# Patient Record
Sex: Female | Born: 1990 | Race: Black or African American | Hispanic: No | Marital: Single | Smoking: Current every day smoker
Health system: Southern US, Community
[De-identification: ages and names within clinical notes are randomized; demographics above are authoritative.]

---

## 2017-06-23 ENCOUNTER — Emergency Department (HOSPITAL_COMMUNITY)
Admission: EM | Admit: 2017-06-23 | Discharge: 2017-06-23 | Disposition: A | Discharge status: Home / Self Care | Payer: Self-pay | Attending: Emergency Medicine | Admitting: Emergency Medicine

## 2017-06-23 ENCOUNTER — Emergency Department (HOSPITAL_COMMUNITY): Payer: Self-pay

## 2017-06-23 ENCOUNTER — Encounter (HOSPITAL_COMMUNITY): Payer: Self-pay

## 2017-06-23 DIAGNOSIS — R519 Headache, unspecified: Secondary | ICD-10-CM

## 2017-06-23 DIAGNOSIS — F17200 Nicotine dependence, unspecified, uncomplicated: Secondary | ICD-10-CM | POA: Insufficient documentation

## 2017-06-23 DIAGNOSIS — R51 Headache: Secondary | ICD-10-CM | POA: Insufficient documentation

## 2017-06-23 LAB — SEDIMENTATION RATE: Sed Rate: 1 mm/hr (ref 0–22)

## 2017-06-23 LAB — COMPREHENSIVE METABOLIC PANEL
ALBUMIN: 3.6 g/dL (ref 3.5–5.0)
ALT: 15 U/L (ref 14–54)
ANION GAP: 5 (ref 5–15)
AST: 23 U/L (ref 15–41)
Alkaline Phosphatase: 38 U/L (ref 38–126)
BILIRUBIN TOTAL: 0.3 mg/dL (ref 0.3–1.2)
BUN: 13 mg/dL (ref 6–20)
CHLORIDE: 108 mmol/L (ref 101–111)
CO2: 27 mmol/L (ref 22–32)
Calcium: 8.8 mg/dL — ABNORMAL LOW (ref 8.9–10.3)
Creatinine, Ser: 0.75 mg/dL (ref 0.44–1.00)
GFR calc Af Amer: >60 mL/min (ref >60)
GFR calc non Af Amer: >60 mL/min (ref >60)
GLUCOSE: 94 mg/dL (ref 65–99)
POTASSIUM: 4.1 mmol/L (ref 3.5–5.1)
SODIUM: 140 mmol/L (ref 135–145)
Total Protein: 6.3 g/dL — ABNORMAL LOW (ref 6.5–8.1)

## 2017-06-23 LAB — CBC WITH DIFFERENTIAL/PLATELET
BASOS ABS: 0 10*3/uL (ref 0.0–0.1)
BASOS PCT: 0 %
EOS ABS: 0.2 10*3/uL (ref 0.0–0.7)
Eosinophils Relative: 3 %
HEMATOCRIT: 35.7 % — AB (ref 36.0–46.0)
Hemoglobin: 12.3 g/dL (ref 12.0–15.0)
Lymphocytes Relative: 50 %
Lymphs Abs: 2.9 10*3/uL (ref 0.7–4.0)
MCH: 32.5 pg (ref 26.0–34.0)
MCHC: 34.5 g/dL (ref 30.0–36.0)
MCV: 94.4 fL (ref 78.0–100.0)
MONO ABS: 0.4 10*3/uL (ref 0.1–1.0)
Monocytes Relative: 7 %
NEUTROS ABS: 2.2 10*3/uL (ref 1.7–7.7)
Neutrophils Relative %: 40 %
PLATELETS: 163 10*3/uL (ref 150–400)
RBC: 3.78 MIL/uL — ABNORMAL LOW (ref 3.87–5.11)
RDW: 12.6 % (ref 11.5–15.5)
WBC: 5.7 10*3/uL (ref 4.0–10.5)

## 2017-06-23 MED ORDER — METHYLPREDNISOLONE SODIUM SUCC 125 MG IJ SOLR
125.0000 mg | Freq: Once | INTRAMUSCULAR | Status: AC
  Administered 2017-06-23: 125 mg via INTRAVENOUS
  Filled 2017-06-23: qty 2

## 2017-06-23 MED ORDER — KETOROLAC TROMETHAMINE 30 MG/ML IJ SOLN
30.0000 mg | Freq: Once | INTRAMUSCULAR | Status: AC
  Administered 2017-06-23: 30 mg via INTRAVENOUS
  Filled 2017-06-23: qty 1

## 2017-06-23 MED ORDER — DIPHENHYDRAMINE HCL 50 MG/ML IJ SOLN
25.0000 mg | Freq: Once | INTRAMUSCULAR | Status: AC
  Administered 2017-06-23: 25 mg via INTRAVENOUS
  Filled 2017-06-23: qty 1

## 2017-06-23 MED ORDER — SODIUM CHLORIDE 0.9 % IV BOLUS (SEPSIS)
1000.0000 mL | Freq: Once | INTRAVENOUS | Status: AC
  Administered 2017-06-23: 1000 mL via INTRAVENOUS

## 2017-06-23 MED ORDER — METOCLOPRAMIDE HCL 5 MG/ML IJ SOLN
10.0000 mg | Freq: Once | INTRAMUSCULAR | Status: AC
Start: 2017-06-23 — End: 2017-06-23
  Administered 2017-06-23: 10 mg via INTRAVENOUS
  Filled 2017-06-23: qty 2

## 2017-06-23 NOTE — ED Provider Notes (Signed)
[facility]-EMERGENCY DEPT Provider Note   CSN: [id number] Arrival date & time: 06/23/17  0739     History   Chief Complaint Chief Complaint  Patient presents with  . Headache    HPI Kamarii D Briles is a 26 y.o. female.   Headache   This is a new problem. The current episode started more than 2 days ago. The problem occurs constantly. The problem has not changed since onset.The headache is associated with nothing. The quality of the pain is described as sharp. The pain is moderate. The pain does not radiate. Pertinent negatives include no anorexia. She has tried nothing for the symptoms. The treatment provided no relief.    History reviewed. No pertinent past medical history.  There are no active problems to display for this patient.   History reviewed. No pertinent surgical history.  OB History    No data available       Home Medications    Prior to Admission medications   Medication Sig Start Date End Date Taking? Authorizing Provider  ibuprofen (ADVIL,MOTRIN) 200 MG tablet Take 400 mg by mouth every 6 (six) hours as needed for headache or moderate pain.   Yes [provider]    Family History No family history on file.  Social History Social History  Substance Use Topics  . Smoking status: Current Every Day Smoker  . Smokeless tobacco: Never Used  . Alcohol use No     Allergies   Patient has no known allergies.   Review of Systems Review of Systems  Gastrointestinal: Negative for anorexia.  Neurological: Positive for headaches.  All other systems reviewed and are negative.    Physical Exam Updated Vital Signs BP 127/86   Pulse 66   Temp 98.7 F (37.1 C)   Resp 16   Wt 54.4 kg (120 lb)   LMP 06/21/2017   SpO2 100%   Physical Exam  Constitutional: She is oriented to person, place, and time. She appears well-developed and well-nourished.  HENT:  Head: Normocephalic and atraumatic.  Eyes: Conjunctivae and EOM are normal.  Neck:  Normal range of motion.  Cardiovascular: Normal rate and regular rhythm.   Pulmonary/Chest: No stridor. No respiratory distress.  Abdominal: Soft. She exhibits no distension.  Neurological: She is alert and oriented to person, place, and time.  No altered mental status, able to give full seemingly accurate history.  Face is symmetric, EOM's intact, pupils equal and reactive, vision intact, tongue and uvula midline without deviation. Upper and Lower extremity motor 5/5, intact pain perception in distal extremities, 2+ reflexes in biceps, patella and achilles tendons. Able to perform finger to nose normal with both hands. Walks without assistance or evident ataxia.    Skin: Skin is warm and dry.  Nursing note and vitals reviewed.    ED Treatments / Results  Labs (all labs ordered are listed, but only abnormal results are displayed) Labs Reviewed  CBC WITH DIFFERENTIAL/PLATELET - Abnormal; Notable for the following:       Result Value   RBC 3.78 (*)    HCT 35.7 (*)    All other components within normal limits  COMPREHENSIVE METABOLIC PANEL - Abnormal; Notable for the following:    Calcium 8.8 (*)    Total Protein 6.3 (*)    All other components within normal limits  SEDIMENTATION RATE    EKG  EKG Interpretation None       Radiology Ct Head Wo Contrast  Result Date: 06/23/2017 CLINICAL DATA:  26-year-old   female with headache and dizziness for several days. EXAM: CT HEAD WITHOUT CONTRAST TECHNIQUE: Contiguous axial images were obtained from the base of the skull through the vertex without intravenous contrast. COMPARISON:  None. FINDINGS: Brain: No evidence of acute infarction, hemorrhage, hydrocephalus, extra-axial collection or mass lesion/mass effect. Vascular: No hyperdense vessel or unexpected calcification. Skull: Normal. Negative for fracture or focal lesion. Sinuses/Orbits: No acute finding. IMPRESSION: Unremarkable CT evaluation of the brain. Electronically Signed   By:  Kristopher Oppenheim M.D.   On: 06/23/2017 10:26    Procedures Procedures (including critical care time)  Medications Ordered in ED Medications  metoCLOPramide (REGLAN) injection 10 mg (10 mg Intravenous Given 06/23/17 1047)  methylPREDNISolone sodium succinate (SOLU-MEDROL) 125 mg/2 mL injection 125 mg (125 mg Intravenous Given 06/23/17 1045)  sodium chloride 0.9 % bolus 1,000 mL (1,000 mLs Intravenous New Bag/Given 06/23/17 1040)  diphenhydrAMINE (BENADRYL) injection 25 mg (25 mg Intravenous Given 06/23/17 1042)  ketorolac (TORADOL) 30 MG/ML injection 30 mg (30 mg Intravenous Given 06/23/17 1111)     Initial Impression / Assessment and Plan / ED Course  I have reviewed the triage vital signs and the nursing notes.  Pertinent labs & imaging results that were available during my care of the patient were reviewed by me and considered in my medical decision making (see chart for details).     The patient arrived with signs and symptoms consistent with a likely migraine headache. The patient has no history of migraines.The patient has a reassuring neuro exam lessening chances of intracranial abnormalities however 2/2 newness a CT/labs/ESR (HA centered around right temple) done and normal. The patient was given decadron, Reglan, Benadryl with patient's pain significantly improved and is ready for discharge. The patient remained in no acute distress, hemodynamically stable with reassuring neuro exam. The patient was then discharged from the Emergency Department with no further acute issues. Recommend follow up with neurologist or PCP within the next week.    Final Clinical Impressions(s) / ED Diagnoses   Final diagnoses:  Nonintractable headache, unspecified chronicity pattern, unspecified headache type    New Prescriptions New Prescriptions   No medications on file     Hammad Finkler, Corene Cornea, MD 06/23/17 1228

## 2017-06-23 NOTE — ED Notes (Signed)
Walked patient to the bathroom patient did well 

## 2017-06-23 NOTE — ED Notes (Signed)
Got patient on the monitor call bell in reach patient is resting

## 2017-06-23 NOTE — ED Triage Notes (Signed)
Pt presents to the ed with complaints of headache that is stabbing in nature on the right side x 1 week. Denies any trauma.  Pt is alert, oriented and ambulatory. Reports that it is worse in bright lights.

## 2019-01-24 IMAGING — CT CT HEAD W/O CM
3 series · 15 of 46 positions shown, 18 images · non-contrast
Comparison: None.

CLINICAL DATA: 26-year-old female with headache and dizziness for
several days.

EXAM:
CT HEAD WITHOUT CONTRAST
TECHNIQUE: Contiguous axial images were obtained from the base of the skull
through the vertex without intravenous contrast.

[Series 3: head 5.0 h30s · axial · 0.41mm/px · z∈[-154,-34]mm · 9 of 29 slices shown, 12 images]
[im 3/29  brain]
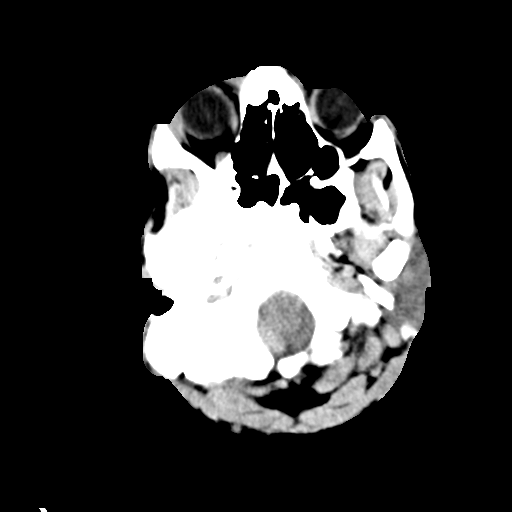
[im 3/29  bone]
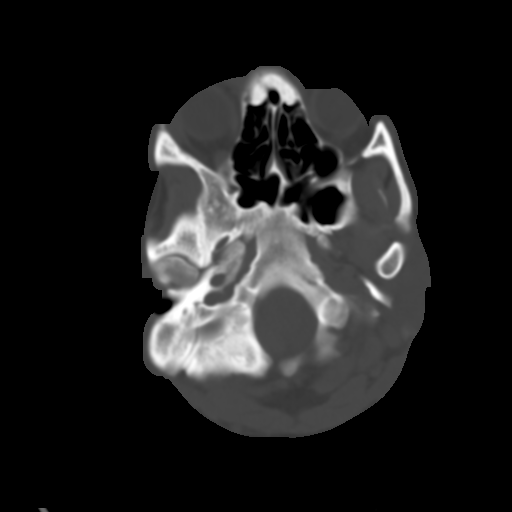
[im 6/29  brain]
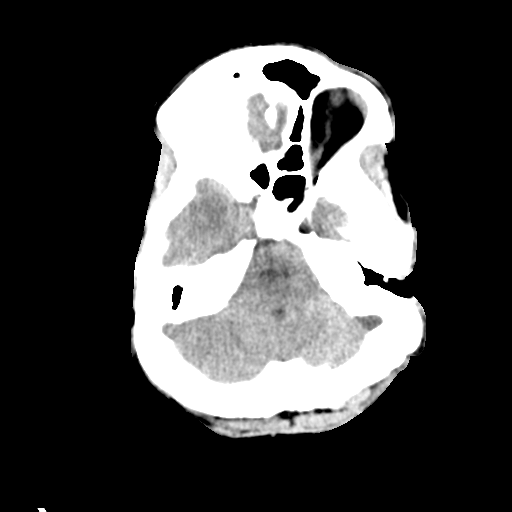
[im 9/29  brain]
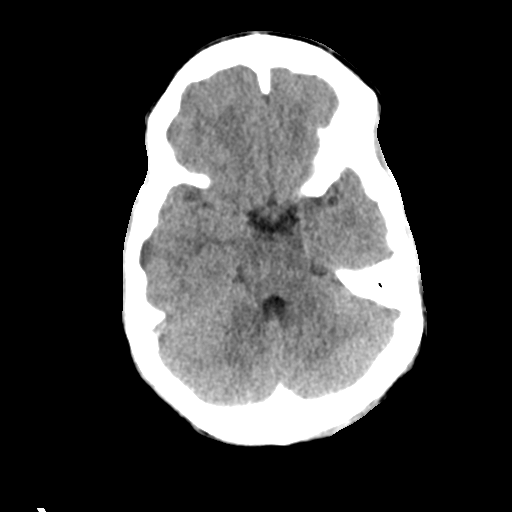
[im 12/29  brain]
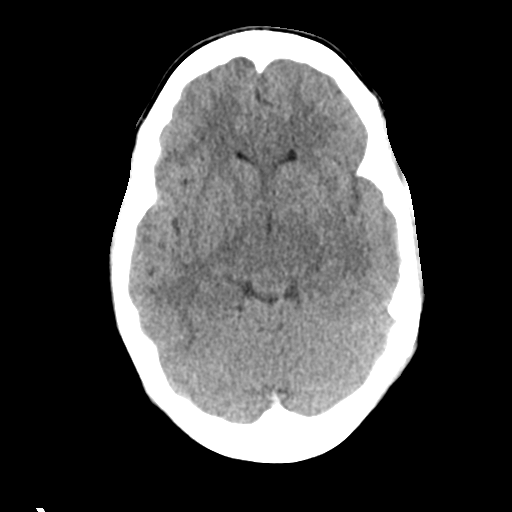
[im 15/29  brain]
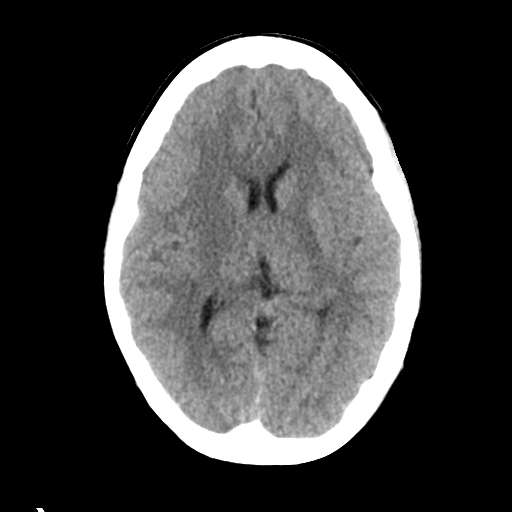
[im 15/29  bone]
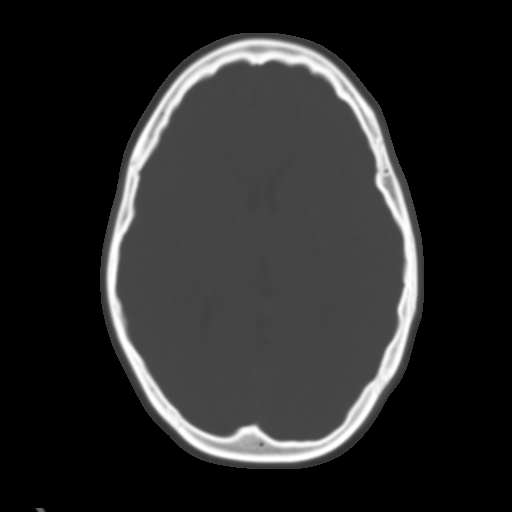
[im 18/29  brain]
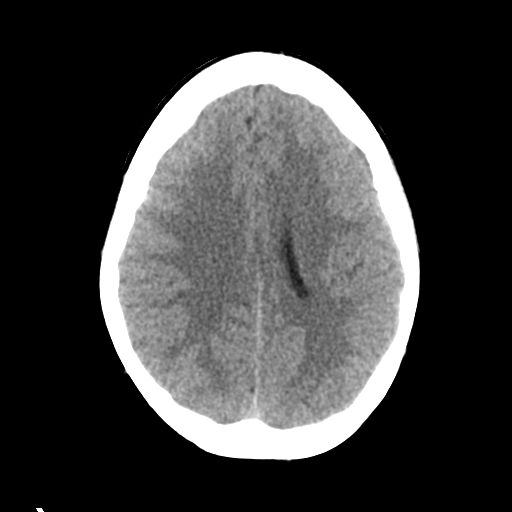
[im 21/29  brain]
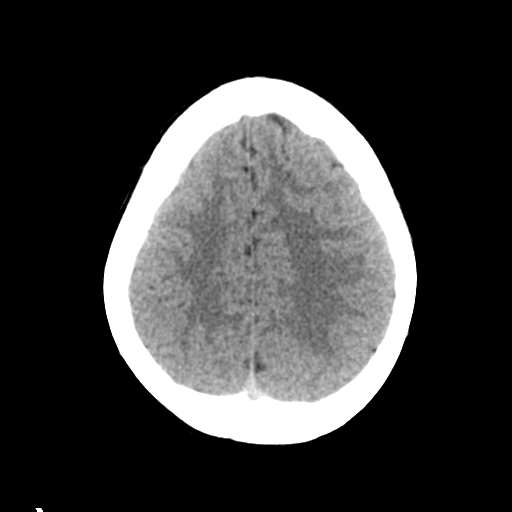
[im 24/29  brain]
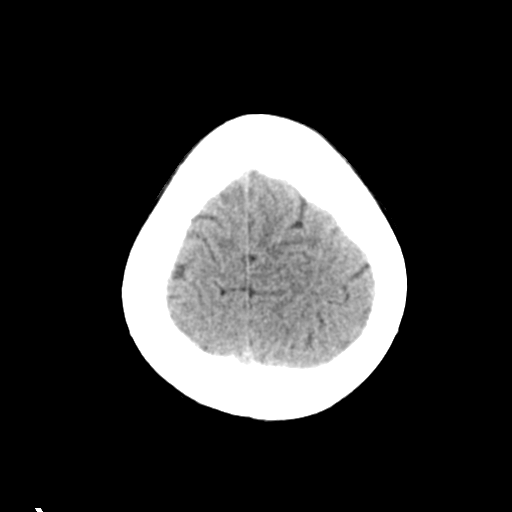
[im 27/29  brain]
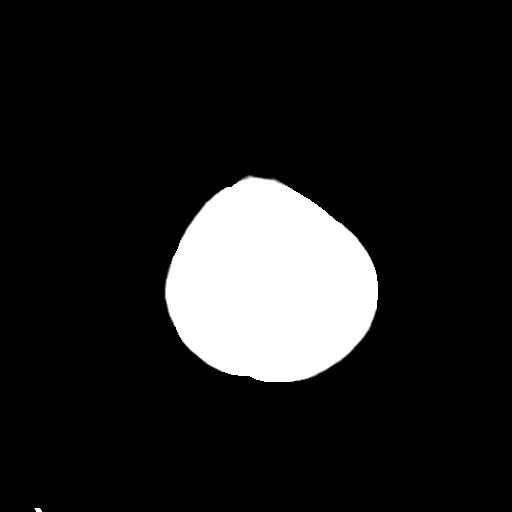
[im 27/29  bone]
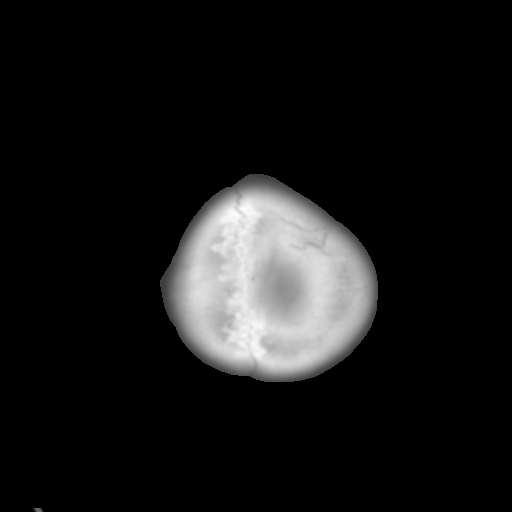

[Series 5: head 3.0 mpr cor · coronal · 0.31mm/px · 3 of 62 slices shown]
[im 21/62  brain]
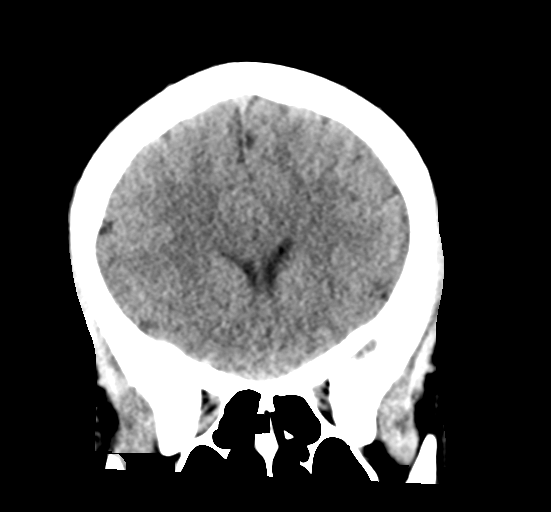
[im 28/62  brain]
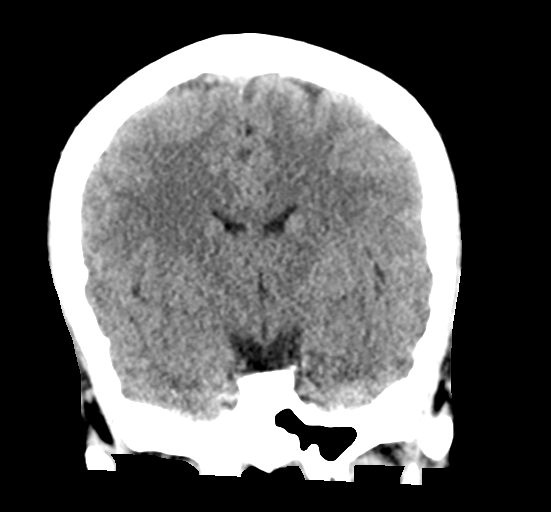
[im 34/62  brain]
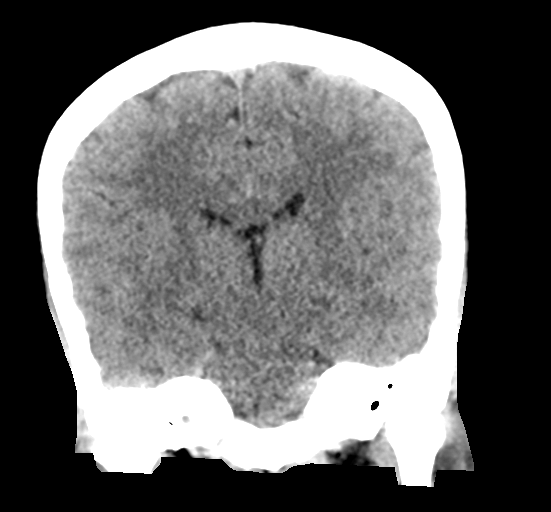

[Series 6: head 3.0 mpr sag · sagittal · 0.30mm/px · 3 of 45 slices shown]
[im 15/45  brain]
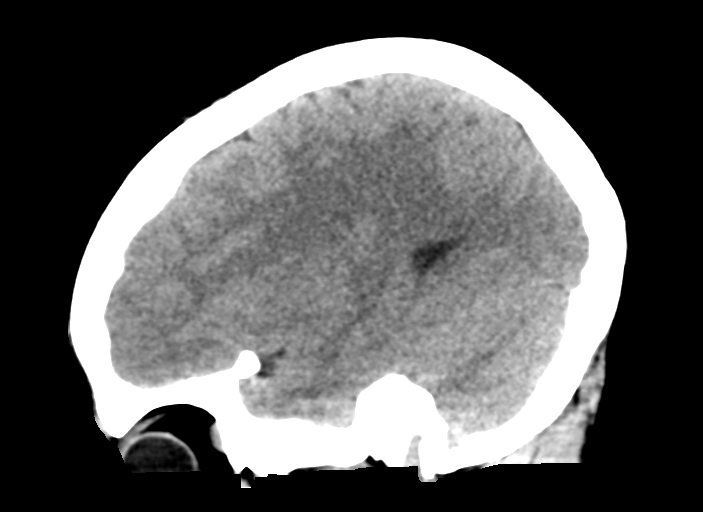
[im 23/45  brain]
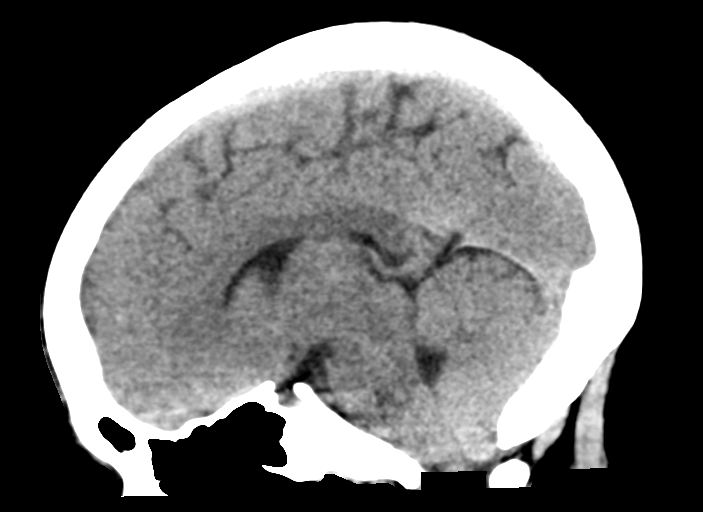
[im 30/45  brain]
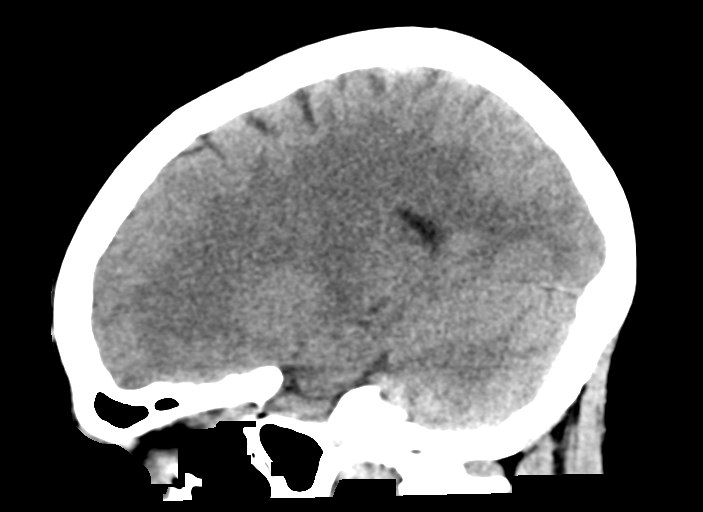

[15 of 46 positions shown; findings below may reference images not displayed]

FINDINGS: Brain: No evidence of acute infarction, hemorrhage, hydrocephalus,
extra-axial collection or mass lesion/mass effect.

Vascular: No hyperdense vessel or unexpected calcification.

Skull: Normal. Negative for fracture or focal lesion.

Sinuses/Orbits: No acute finding.
IMPRESSION: Unremarkable CT evaluation of the brain.
